# Patient Record
Sex: Male | Born: 1985 | Race: Black or African American | Hispanic: No | Marital: Married | Smoking: Never smoker
Health system: Southern US, Community
[De-identification: ages and names within clinical notes are randomized; demographics above are authoritative.]

## PROBLEM LIST (undated history)

## (undated) HISTORY — PX: KNEE SURGERY: SHX244

## (undated) HISTORY — PX: CIRCUMCISION: SUR203

---

## 2019-10-26 ENCOUNTER — Ambulatory Visit: Payer: BLUE CROSS/BLUE SHIELD | Attending: Internal Medicine

## 2019-10-26 DIAGNOSIS — Z20822 Contact with and (suspected) exposure to covid-19: Secondary | ICD-10-CM

## 2019-10-27 LAB — NOVEL CORONAVIRUS, NAA: SARS-CoV-2, NAA: DETECTED — AB

## 2020-01-01 ENCOUNTER — Emergency Department (HOSPITAL_COMMUNITY): Payer: BLUE CROSS/BLUE SHIELD

## 2020-01-01 ENCOUNTER — Other Ambulatory Visit: Payer: Self-pay

## 2020-01-01 ENCOUNTER — Emergency Department (HOSPITAL_COMMUNITY)
Admission: EM | Admit: 2020-01-01 | Discharge: 2020-01-01 | Disposition: A | Discharge status: Home / Self Care | Payer: BLUE CROSS/BLUE SHIELD | Attending: Emergency Medicine | Admitting: Emergency Medicine

## 2020-01-01 ENCOUNTER — Encounter (HOSPITAL_COMMUNITY): Payer: Self-pay | Admitting: Radiology

## 2020-01-01 DIAGNOSIS — Z8611 Personal history of tuberculosis: Secondary | ICD-10-CM | POA: Insufficient documentation

## 2020-01-01 DIAGNOSIS — Z79899 Other long term (current) drug therapy: Secondary | ICD-10-CM | POA: Insufficient documentation

## 2020-01-01 DIAGNOSIS — R0789 Other chest pain: Secondary | ICD-10-CM

## 2020-01-01 LAB — COMPREHENSIVE METABOLIC PANEL
ALT: 24 U/L (ref 0–44)
AST: 23 U/L (ref 15–41)
Albumin: 4.6 g/dL (ref 3.5–5.0)
Alkaline Phosphatase: 41 U/L (ref 38–126)
Anion gap: 10 (ref 5–15)
BUN: 11 mg/dL (ref 6–20)
CO2: 25 mmol/L (ref 22–32)
Calcium: 9.4 mg/dL (ref 8.9–10.3)
Chloride: 103 mmol/L (ref 98–111)
Creatinine, Ser: 1.08 mg/dL (ref 0.61–1.24)
GFR calc Af Amer: >60 mL/min (ref >60)
GFR calc non Af Amer: >60 mL/min (ref >60)
Glucose, Bld: 93 mg/dL (ref 70–99)
Potassium: 3.8 mmol/L (ref 3.5–5.1)
Sodium: 138 mmol/L (ref 135–145)
Total Bilirubin: 0.6 mg/dL (ref 0.3–1.2)
Total Protein: 7.6 g/dL (ref 6.5–8.1)

## 2020-01-01 LAB — CBC WITH DIFFERENTIAL/PLATELET
Abs Immature Granulocytes: 0.01 10*3/uL (ref 0.00–0.07)
Basophils Absolute: 0 10*3/uL (ref 0.0–0.1)
Basophils Relative: 1 %
Eosinophils Absolute: 0 10*3/uL (ref 0.0–0.5)
Eosinophils Relative: 1 %
HCT: 48.6 % (ref 39.0–52.0)
Hemoglobin: 15.8 g/dL (ref 13.0–17.0)
Immature Granulocytes: 0 %
Lymphocytes Relative: 60 %
Lymphs Abs: 2.3 10*3/uL (ref 0.7–4.0)
MCH: 27 pg (ref 26.0–34.0)
MCHC: 32.5 g/dL (ref 30.0–36.0)
MCV: 82.9 fL (ref 80.0–100.0)
Monocytes Absolute: 0.3 10*3/uL (ref 0.1–1.0)
Monocytes Relative: 8 %
Neutro Abs: 1.2 10*3/uL — ABNORMAL LOW (ref 1.7–7.7)
Neutrophils Relative %: 30 %
Platelets: 223 10*3/uL (ref 150–400)
RBC: 5.86 MIL/uL — ABNORMAL HIGH (ref 4.22–5.81)
RDW: 12.9 % (ref 11.5–15.5)
WBC: 3.9 10*3/uL — ABNORMAL LOW (ref 4.0–10.5)
nRBC: 0 % (ref 0.0–0.2)

## 2020-01-01 LAB — LIPASE, BLOOD: Lipase: 34 U/L (ref 11–51)

## 2020-01-01 LAB — CHOLESTEROL, TOTAL: Cholesterol: 226 mg/dL — ABNORMAL HIGH (ref 0–200)

## 2020-01-01 LAB — D-DIMER, QUANTITATIVE: D-Dimer, Quant: 0.98 ug/mL-FEU — ABNORMAL HIGH (ref 0.00–0.50)

## 2020-01-01 MED ORDER — IOHEXOL 350 MG/ML SOLN
100.0000 mL | Freq: Once | INTRAVENOUS | Status: AC | PRN
  Administered 2020-01-01: 100 mL via INTRAVENOUS

## 2020-01-01 MED ORDER — SODIUM CHLORIDE (PF) 0.9 % IJ SOLN
INTRAMUSCULAR | Status: AC
  Filled 2020-01-01: qty 50

## 2020-01-01 MED ORDER — PANTOPRAZOLE SODIUM 20 MG PO TBEC: 20.0000 mg | DELAYED_RELEASE_TABLET | Freq: Every day | ORAL | 0 refills | Status: AC

## 2020-01-01 NOTE — ED Provider Notes (Addendum)
[facility]-EMERGENCY DEPT Provider Note   CSN: [id number] Arrival date & time: 01/01/20  0741     History Chief Complaint  Patient presents with  . Chest Pain    Edwin Griffin is a 34 y.o. male.  HPI Patient reports that he has been getting sharp chest pains off and on for several weeks.  Pains have gotten more persistent and intense over the past several days.  Last night, patient reports that he was having pains that were making it difficult to sleep.  Pains are typically sharp and focal.  He reports there are 3 fairly specific areas that this occurs.  One of them is in his mid thoracic back.  There are then 2 additional sites, one on each side of the chest at about the area of the nipple or slightly lateral.  He reports it might at times be one of them that sends a pain and then at other times may be all 3 was sent to pain.  He did historically get brief twinges of pain that were similar but the frequency and intensity have now increased.  Patient denies he feels short of breath with this aside from the fact that when the pain hits and is fairly intense it catches his breath.  There is a positional quality.  He reports that he was working out yesterday and everything is going fine and then one of the pains hit him.  No associated symptoms.  Patient denies he has any cough.  He has not had fevers or chills.  Patient reports he had tuberculosis 10 years ago.  He reports at that time he had similar pains that were much worse.  Ultimately, all of those symptoms resolved and he got better.  He has not had any pain or swelling of the legs or calves.  He denies any radiation of pain into his arms.  Symptoms are not particularly exacerbated by eating.  He reports sometimes though he thinks that the pain is "gas".  If he drinks a warm tea that may improve the symptoms.    No past medical history on file.  There are no problems to display for this patient.        No  family history on file.  Social History   Tobacco Use  . Smoking status: Not on file  Substance Use Topics  . Alcohol use: Not on file  . Drug use: Not on file    Home Medications Prior to Admission medications   Medication Sig Start Date End Date Taking? Authorizing Provider  Cholecalciferol (VITAMIN D-3) 25 MCG (1000 UT) CAPS Take 1,000 Units by mouth 2 (two) times a week.   Yes [provider]  Multiple Vitamins-Minerals (MULTIVITAMIN WITH MINERALS) tablet Take 1 tablet by mouth daily.   Yes [provider]  Probiotic Product (PROBIOTIC DAILY PO) Take 1 tablet by mouth daily at 12 noon. Prostate   Yes [provider]  pantoprazole (PROTONIX) 20 MG tablet Take 1 tablet (20 mg total) by mouth daily. 01/01/20   Arby Barrette, MD    Allergies    Patient has no known allergies.  Review of Systems   Review of Systems 10 Systems reviewed and are negative for acute change except as noted in the HPI.  Physical Exam Updated Vital Signs BP (!) 142/78   Pulse 72   Temp 98.2 F (36.8 C) (Oral)   Resp 11   Ht 5\' 11"  (1.803 m)   Wt 90.7 kg  SpO2 100%   BMI 27.89 kg/m   Physical Exam Constitutional:      Comments: Patient is alert nontoxic and clinically well in appearance.  HENT:     Head: Normocephalic and atraumatic.  Eyes:     Extraocular Movements: Extraocular movements intact.  Cardiovascular:     Rate and Rhythm: Normal rate and regular rhythm.  Pulmonary:     Effort: Pulmonary effort is normal.     Breath sounds: Normal breath sounds.  Chest:     Chest wall: No tenderness.  Abdominal:     General: There is no distension.     Palpations: Abdomen is soft.     Tenderness: There is no abdominal tenderness. There is no guarding.  Musculoskeletal:        General: No swelling or tenderness. Normal range of motion.     Cervical back: Neck supple.     Right lower leg: No edema.     Left lower leg: No edema.  Skin:    General: Skin is warm  and dry.  Neurological:     General: No focal deficit present.     Mental Status: He is oriented to person, place, and time.     Cranial Nerves: No cranial nerve deficit.     Coordination: Coordination normal.  Psychiatric:        Mood and Affect: Mood normal.     ED Results / Procedures / Treatments   Labs (all labs ordered are listed, but only abnormal results are displayed) Labs Reviewed  CBC WITH DIFFERENTIAL/PLATELET - Abnormal; Notable for the following components:      Result Value   WBC 3.9 (*)    RBC 5.86 (*)    Neutro Abs 1.2 (*)    All other components within normal limits  D-DIMER, QUANTITATIVE (NOT AT [facility]) - Abnormal; Notable for the following components:   D-Dimer, Quant 0.98 (*)    All other components within normal limits  COMPREHENSIVE METABOLIC PANEL  LIPASE, BLOOD  CHOLESTEROL, TOTAL    EKG EKG Interpretation  Date/Time:  Tuesday January 01 2020 07:53:02 EST Ventricular Rate:  57 PR Interval:    QRS Duration: 92 QT Interval:  391 QTC Calculation: 381 R Axis:   63 Text Interpretation: Sinus rhythm ST elev, probable normal early repol pattern agree, no old comparison Confirmed by Charlesetta Shanks [phone]) on 01/01/2020 8:01:40 AM   Radiology DG Chest 2 View  Result Date: 01/01/2020 CLINICAL DATA:  Chest pain EXAM: CHEST - 2 VIEW COMPARISON:  None. FINDINGS: Lungs are clear. The heart size and pulmonary vascularity are normal. No adenopathy. No pneumothorax. No bone lesions. IMPRESSION: No abnormality noted. Electronically Signed   By: Lowella Grip III M.D.   On: 01/01/2020 09:02   CT Angio Chest PE W/Cm &/Or Wo Cm  Result Date: 01/01/2020 CLINICAL DATA:  Shortness of breath and chest pain EXAM: CT ANGIOGRAPHY CHEST WITH CONTRAST TECHNIQUE: Multidetector CT imaging of the chest was performed using the standard protocol during bolus administration of intravenous contrast. Multiplanar CT image reconstructions and MIPs were obtained to evaluate the  vascular anatomy. CONTRAST:  146mL OMNIPAQUE IOHEXOL 350 MG/ML SOLN COMPARISON:  January 01, 2020 FINDINGS: Cardiovascular: There is no demonstrable pulmonary embolus. There is no thoracic aortic aneurysm or dissection. Visualized great vessels appear normal. No pericardial effusion or pericardial thickening evident. The main pulmonary outflow tract measures 3.7 cm, prominent. Mediastinum/Nodes: Visualized thyroid appears normal. No appreciable thoracic adenopathy. No esophageal lesions are evident. Lungs/Pleura: There is  atelectatic change in the left apical region with questionable mild superimposed scarring. There is no evident edema or airspace opacity. There is slight atelectasis in the left base. No pleural effusions are evident. Upper Abdomen: Visualized upper abdominal structures appear normal. Musculoskeletal: No blastic or lytic bone lesions. No evident chest wall lesions. Review of the MIP images confirms the above findings. IMPRESSION: 1. No demonstrable pulmonary embolus. No thoracic aortic aneurysm or dissection. 2. Prominence of the main pulmonary outflow tract, a finding indicative of pulmonary arterial hypertension. 3. Atelectasis and mild scarring left apex. Atelectasis left base. No edema or airspace opacity. 4.  No evident adenopathy. Electronically Signed   By: Bretta Bang III M.D.   On: 01/01/2020 11:37    Procedures Procedures (including critical care time)  Medications Ordered in ED Medications  sodium chloride (PF) 0.9 % injection (has no administration in time range)  iohexol (OMNIPAQUE) 350 MG/ML injection 100 mL (100 mLs Intravenous Contrast Given 01/01/20 1120)    ED Course  I have reviewed the triage vital signs and the nursing notes.  Pertinent labs & imaging results that were available during my care of the patient were reviewed by me and considered in my medical decision making (see chart for details).    MDM Rules/Calculators/A&P                     Patient  has variable pleuritic type chest pain.  D-dimer was elevated.  CT PE study negative for acute findings.  No infiltrates or signs of infectious etiology.  No lymphadenopathy.  Patient does have history of TB but no findings of cavitary lesions or significant, large scarring or bullae.  Patient is clinically well in appearance.  No fevers no myalgias no shortness of breath aside from occasional limitation of inspiration due to pain.  At this time, differential diagnosis includes pleurisy also consideration for esophageal etiology such as reflux or esophageal spasm.  Will recommend a trial of 2 weeks of Protonix.  Patient is to follow-up with his PCP ASAP.  Return precautions included in discharge instructions. Final Clinical Impression(s) / ED Diagnoses Final diagnoses:  Atypical chest pain  H/O tuberculosis    Rx / DC Orders ED Discharge Orders         Ordered    pantoprazole (PROTONIX) 20 MG tablet  Daily     01/01/20 1207           Arby Barrette, MD 01/01/20 1212    Arby Barrette, MD 01/01/20 1215

## 2020-01-01 NOTE — ED Notes (Signed)
Pt transported to CT at this time.

## 2020-01-01 NOTE — Discharge Instructions (Signed)
1.  Schedule follow-up with your doctor when you return home within the next 1 to 2 weeks. 2.  Start taking Protonix daily for 2 weeks.  Follow instructions for diet for gastroesophageal reflux disease. 3.  Return to the emergency department if you develop fevers, shortness of breath, persisting or worsening chest pain or other concerning symptoms.

## 2020-01-01 NOTE — ED Triage Notes (Signed)
Pateint complains of intermittent chest pain, gets worse with exertion and occasional SOB with pain, describes it as mid-chest that radiates to R chest and back. Hx of TB, states it is in remission and COVID in December.

## 2020-01-01 NOTE — ED Notes (Signed)
ED Provider at bedside. 

## 2020-02-24 IMAGING — CT CT ANGIO CHEST
2 of 6 series · 18 of 36 positions shown · IV contrast (OMNIPAQUE 350)
Comparison: January 01, 2020

CLINICAL DATA: Shortness of breath and chest pain

EXAM:
CT ANGIOGRAPHY CHEST WITH CONTRAST
TECHNIQUE: Multidetector CT imaging of the chest was performed using the
standard protocol during bolus administration of intravenous
contrast. Multiplanar CT image reconstructions and MIPs were
obtained to evaluate the vascular anatomy.
CONTRAST:  100mL OMNIPAQUE IOHEXOL 350 MG/ML SOLN

[Series 5: thins · axial · 0.66mm/px · z∈[-314,-92]mm · 17 of 250 slices shown]
[im 14/250  lung]
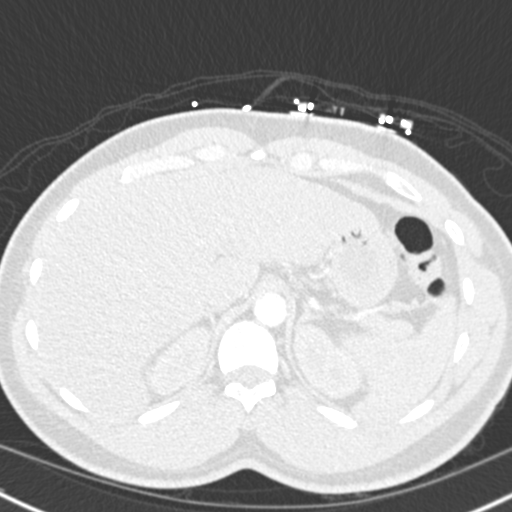
[im 28/250  mediastinal]
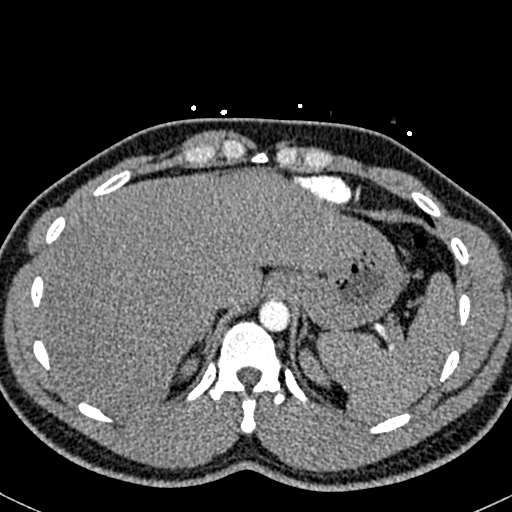
[im 42/250  lung]
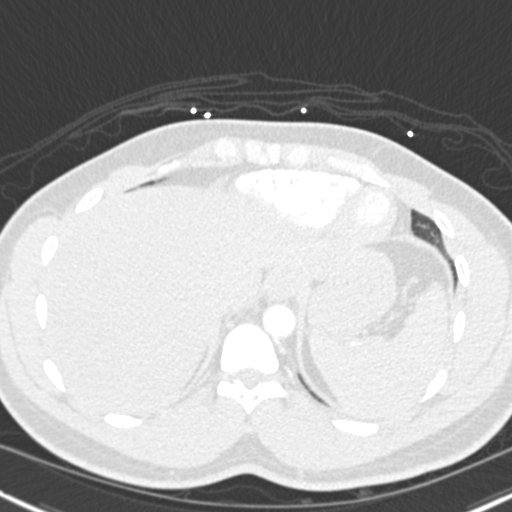
[im 56/250  mediastinal]
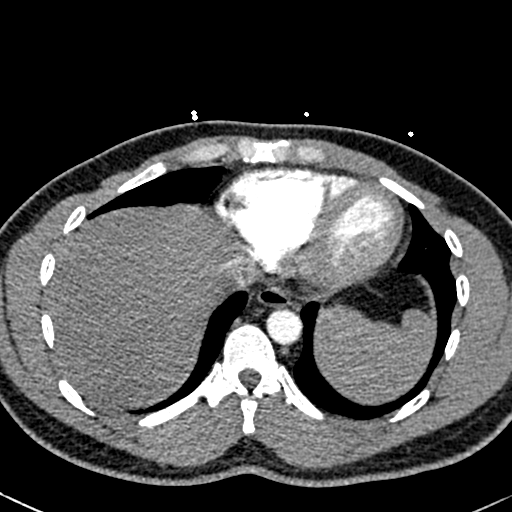
[im 70/250  lung]
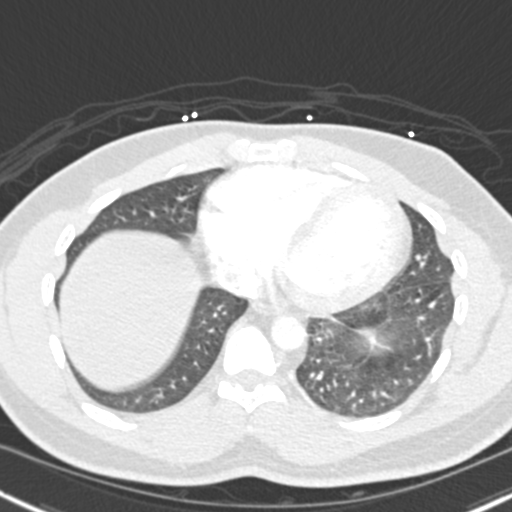
[im 84/250  mediastinal]
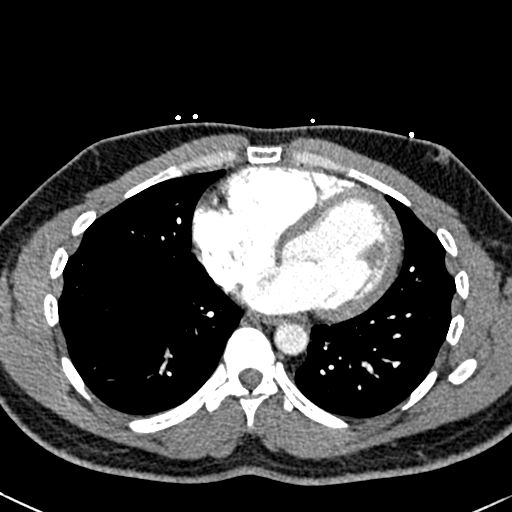
[im 97/250  lung]
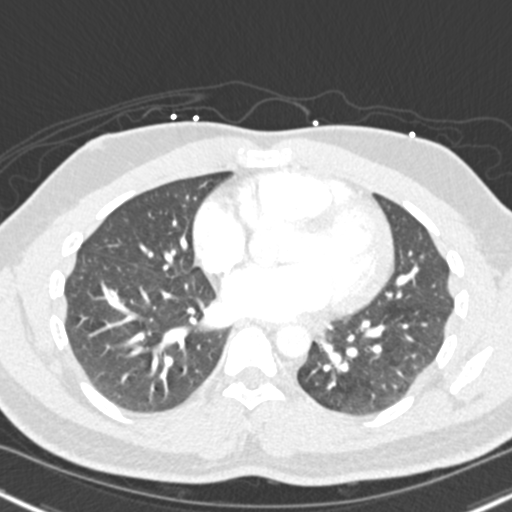
[im 111/250  mediastinal]
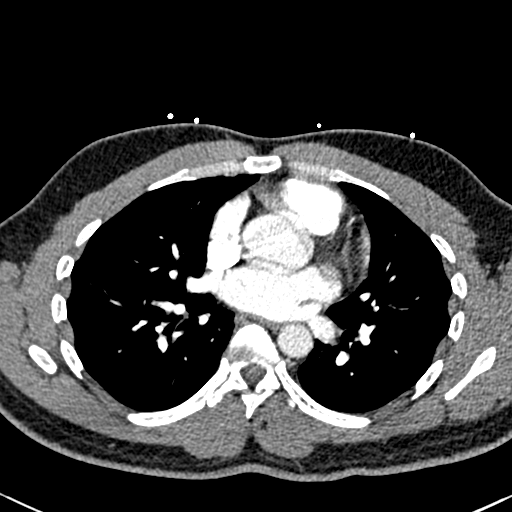
[im 125/250  lung]
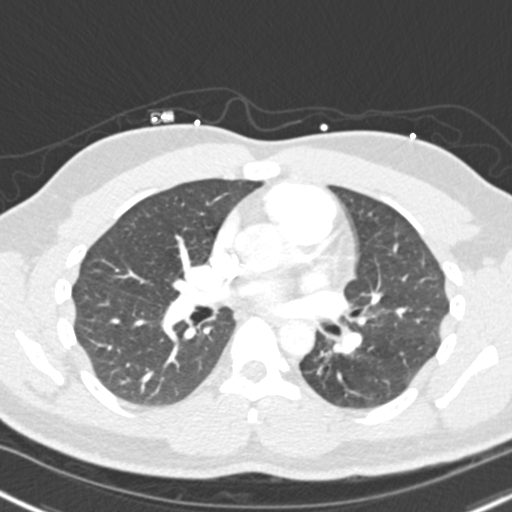
[im 139/250  mediastinal]
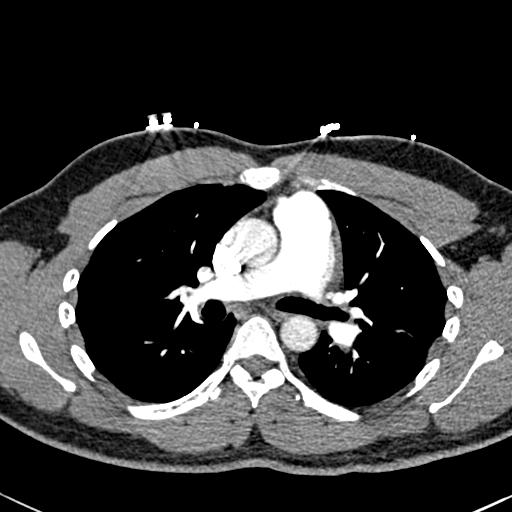
[im 153/250  lung]
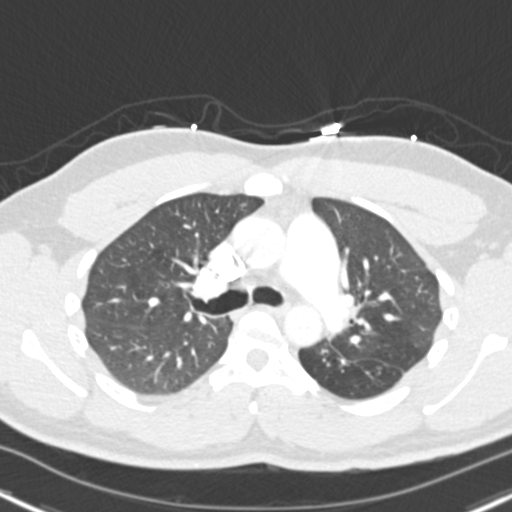
[im 167/250  mediastinal]
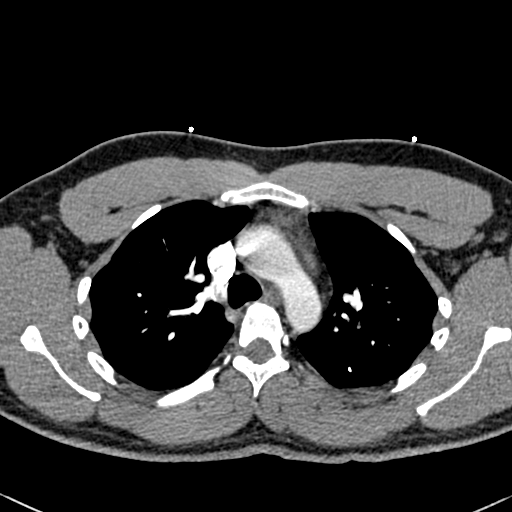
[im 180/250  lung]
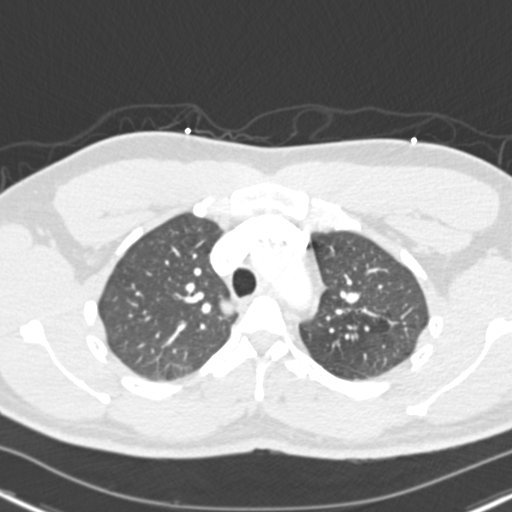
[im 194/250  mediastinal]
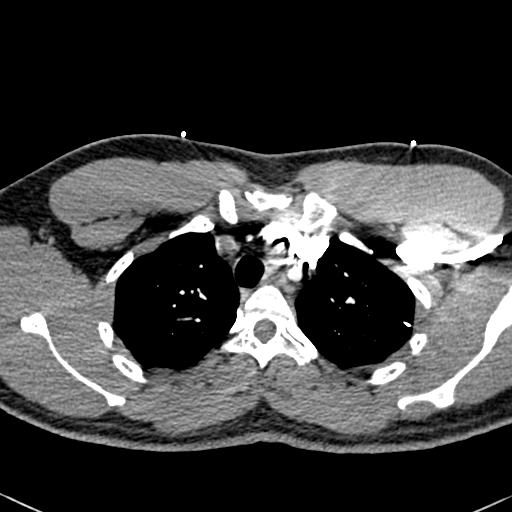
[im 208/250  lung]
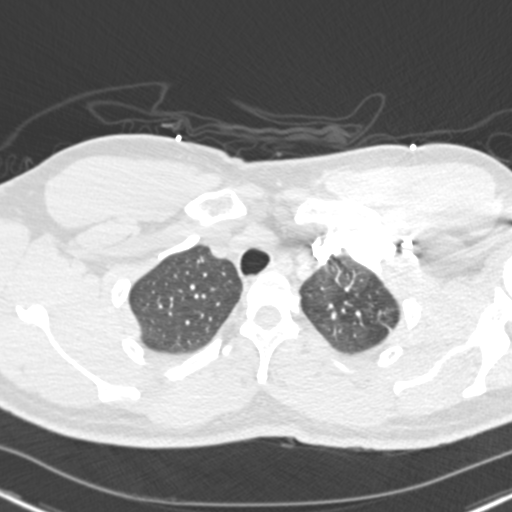
[im 222/250  mediastinal]
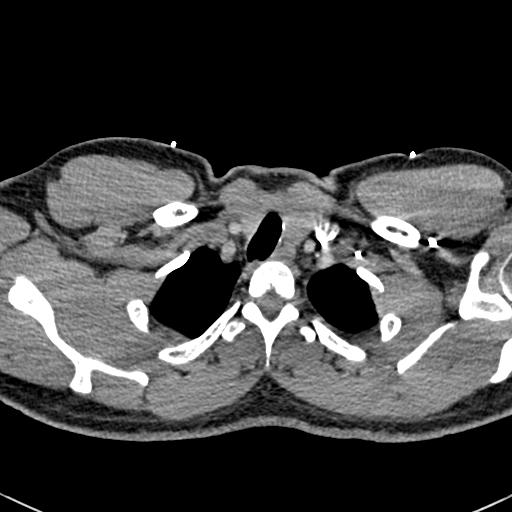
[im 236/250  lung]
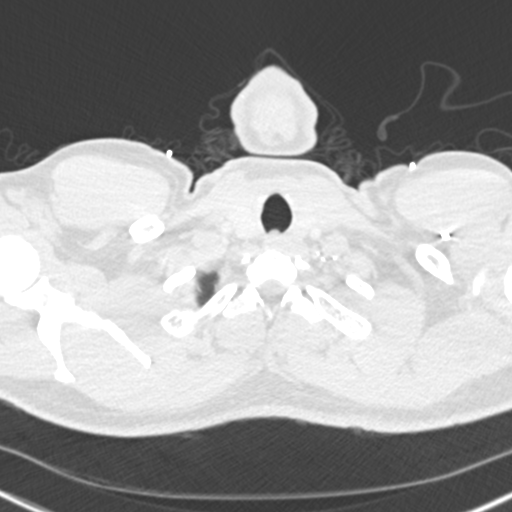

[Series 7: coronal mpr · coronal · 0.51mm/px · 1 of 115 slices shown]
[im 58/115  mediastinal]
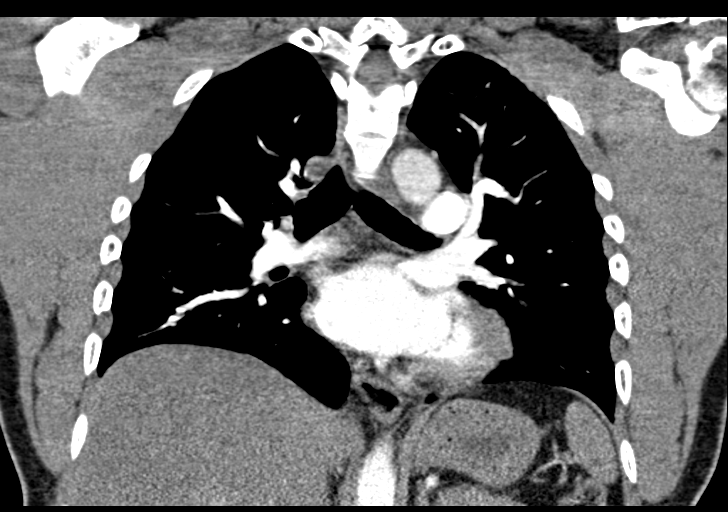

[18 of 36 positions shown; findings below may reference images not displayed]

FINDINGS: Cardiovascular: There is no demonstrable pulmonary embolus. There is
no thoracic aortic aneurysm or dissection. Visualized great vessels
appear normal. No pericardial effusion or pericardial thickening
evident.

The main pulmonary outflow tract measures 3.7 cm, prominent.

Mediastinum/Nodes: Visualized thyroid appears normal. No appreciable
thoracic adenopathy. No esophageal lesions are evident.

Lungs/Pleura: There is atelectatic change in the left apical region
with questionable mild superimposed scarring. There is no evident
edema or airspace opacity. There is slight atelectasis in the left
base. No pleural effusions are evident.

Upper Abdomen: Visualized upper abdominal structures appear normal.

Musculoskeletal: No blastic or lytic bone lesions. No evident chest
wall lesions.

Review of the MIP images confirms the above findings.
IMPRESSION: 1. No demonstrable pulmonary embolus. No thoracic aortic aneurysm or
dissection.

2. Prominence of the main pulmonary outflow tract, a finding
indicative of pulmonary arterial hypertension.

3. Atelectasis and mild scarring left apex. Atelectasis left base.
No edema or airspace opacity.

4.  No evident adenopathy.

## 2020-11-19 ENCOUNTER — Other Ambulatory Visit: Payer: Self-pay

## 2020-11-19 ENCOUNTER — Emergency Department (HOSPITAL_COMMUNITY)
Admission: EM | Admit: 2020-11-19 | Discharge: 2020-11-20 | Disposition: A | Discharge status: Home / Self Care | Payer: BLUE CROSS/BLUE SHIELD | Attending: Emergency Medicine | Admitting: Emergency Medicine

## 2020-11-19 ENCOUNTER — Encounter (HOSPITAL_COMMUNITY): Payer: Self-pay

## 2020-11-19 DIAGNOSIS — Z5321 Procedure and treatment not carried out due to patient leaving prior to being seen by health care provider: Secondary | ICD-10-CM | POA: Diagnosis not present

## 2020-11-19 DIAGNOSIS — R0789 Other chest pain: Secondary | ICD-10-CM | POA: Insufficient documentation

## 2020-11-19 LAB — BASIC METABOLIC PANEL
Anion gap: 11 (ref 5–15)
BUN: 12 mg/dL (ref 6–20)
CO2: 24 mmol/L (ref 22–32)
Calcium: 9.4 mg/dL (ref 8.9–10.3)
Chloride: 102 mmol/L (ref 98–111)
Creatinine, Ser: 1.1 mg/dL (ref 0.61–1.24)
GFR, Estimated: >60 mL/min (ref >60)
Glucose, Bld: 106 mg/dL — ABNORMAL HIGH (ref 70–99)
Potassium: 4.1 mmol/L (ref 3.5–5.1)
Sodium: 137 mmol/L (ref 135–145)

## 2020-11-19 LAB — CBC
HCT: 45.6 % (ref 39.0–52.0)
Hemoglobin: 14.8 g/dL (ref 13.0–17.0)
MCH: 27.3 pg (ref 26.0–34.0)
MCHC: 32.5 g/dL (ref 30.0–36.0)
MCV: 84.1 fL (ref 80.0–100.0)
Platelets: 271 10*3/uL (ref 150–400)
RBC: 5.42 MIL/uL (ref 4.22–5.81)
RDW: 13.2 % (ref 11.5–15.5)
WBC: 5.1 10*3/uL (ref 4.0–10.5)
nRBC: 0 % (ref 0.0–0.2)

## 2020-11-19 LAB — TROPONIN I (HIGH SENSITIVITY): Troponin I (High Sensitivity): 2 ng/L (ref <18)

## 2020-11-19 NOTE — ED Triage Notes (Signed)
Pt arrived via walk in, c/o midsternal chest pain worsening with deep breathing, radiating to right arm x4 days. States family tested positive for covid x2 weeks ago, he tested negative and never had sx.

## 2020-11-19 NOTE — ED Notes (Signed)
Pt sts he cant wait any longer and is leaving

## 2021-01-02 ENCOUNTER — Emergency Department (HOSPITAL_COMMUNITY): Payer: BLUE CROSS/BLUE SHIELD

## 2021-01-02 ENCOUNTER — Encounter (HOSPITAL_COMMUNITY): Payer: Self-pay

## 2021-01-02 ENCOUNTER — Other Ambulatory Visit: Payer: Self-pay

## 2021-01-02 ENCOUNTER — Emergency Department (HOSPITAL_COMMUNITY)
Admission: EM | Admit: 2021-01-02 | Discharge: 2021-01-02 | Disposition: A | Discharge status: Home / Self Care | Payer: BLUE CROSS/BLUE SHIELD | Attending: Emergency Medicine | Admitting: Emergency Medicine

## 2021-01-02 DIAGNOSIS — M545 Low back pain, unspecified: Secondary | ICD-10-CM | POA: Diagnosis not present

## 2021-01-02 DIAGNOSIS — A09 Infectious gastroenteritis and colitis, unspecified: Secondary | ICD-10-CM | POA: Insufficient documentation

## 2021-01-02 DIAGNOSIS — R6889 Other general symptoms and signs: Secondary | ICD-10-CM

## 2021-01-02 DIAGNOSIS — R509 Fever, unspecified: Secondary | ICD-10-CM | POA: Diagnosis not present

## 2021-01-02 DIAGNOSIS — R0981 Nasal congestion: Secondary | ICD-10-CM | POA: Diagnosis not present

## 2021-01-02 DIAGNOSIS — Z20822 Contact with and (suspected) exposure to covid-19: Secondary | ICD-10-CM | POA: Diagnosis not present

## 2021-01-02 DIAGNOSIS — R079 Chest pain, unspecified: Secondary | ICD-10-CM | POA: Diagnosis not present

## 2021-01-02 LAB — COMPREHENSIVE METABOLIC PANEL
ALT: 24 U/L (ref 0–44)
AST: 22 U/L (ref 15–41)
Albumin: 4.5 g/dL (ref 3.5–5.0)
Alkaline Phosphatase: 46 U/L (ref 38–126)
Anion gap: 9 (ref 5–15)
BUN: 10 mg/dL (ref 6–20)
CO2: 26 mmol/L (ref 22–32)
Calcium: 9.7 mg/dL (ref 8.9–10.3)
Chloride: 101 mmol/L (ref 98–111)
Creatinine, Ser: 1.02 mg/dL (ref 0.61–1.24)
GFR, Estimated: >60 mL/min (ref >60)
Glucose, Bld: 97 mg/dL (ref 70–99)
Potassium: 3.7 mmol/L (ref 3.5–5.1)
Sodium: 136 mmol/L (ref 135–145)
Total Bilirubin: 0.7 mg/dL (ref 0.3–1.2)
Total Protein: 7.9 g/dL (ref 6.5–8.1)

## 2021-01-02 LAB — CBC WITH DIFFERENTIAL/PLATELET
Abs Immature Granulocytes: 0.01 10*3/uL (ref 0.00–0.07)
Basophils Absolute: 0 10*3/uL (ref 0.0–0.1)
Basophils Relative: 1 %
Eosinophils Absolute: 0.1 10*3/uL (ref 0.0–0.5)
Eosinophils Relative: 1 %
HCT: 46.3 % (ref 39.0–52.0)
Hemoglobin: 15 g/dL (ref 13.0–17.0)
Immature Granulocytes: 0 %
Lymphocytes Relative: 29 %
Lymphs Abs: 1.7 10*3/uL (ref 0.7–4.0)
MCH: 26.8 pg (ref 26.0–34.0)
MCHC: 32.4 g/dL (ref 30.0–36.0)
MCV: 82.7 fL (ref 80.0–100.0)
Monocytes Absolute: 0.4 10*3/uL (ref 0.1–1.0)
Monocytes Relative: 7 %
Neutro Abs: 3.6 10*3/uL (ref 1.7–7.7)
Neutrophils Relative %: 62 %
Platelets: 237 10*3/uL (ref 150–400)
RBC: 5.6 MIL/uL (ref 4.22–5.81)
RDW: 13 % (ref 11.5–15.5)
WBC: 5.9 10*3/uL (ref 4.0–10.5)
nRBC: 0 % (ref 0.0–0.2)

## 2021-01-02 LAB — RESP PANEL BY RT-PCR (FLU A&B, COVID) ARPGX2
Influenza A by PCR: NEGATIVE
Influenza B by PCR: NEGATIVE
SARS Coronavirus 2 by RT PCR: NEGATIVE

## 2021-01-02 LAB — PARASITE EXAM SCREEN, BLOOD-W CONF TO LABCORP (NOT @ ARMC)

## 2021-01-02 LAB — TROPONIN I (HIGH SENSITIVITY): Troponin I (High Sensitivity): 3 ng/L (ref <18)

## 2021-01-02 MED ORDER — ACETAMINOPHEN 325 MG PO TABS
650.0000 mg | ORAL_TABLET | Freq: Once | ORAL | Status: AC
  Administered 2021-01-02: 650 mg via ORAL
  Filled 2021-01-02: qty 2

## 2021-01-02 MED ORDER — AZITHROMYCIN 250 MG PO TABS
1000.0000 mg | ORAL_TABLET | Freq: Once | ORAL | Status: AC
  Administered 2021-01-02: 1000 mg via ORAL
  Filled 2021-01-02: qty 4

## 2021-01-02 NOTE — ED Triage Notes (Signed)
Patient c/o chest pain, fever, diarrhea, and left lower back pain that radiates into the left leg since yesterday.

## 2021-01-02 NOTE — ED Provider Notes (Addendum)
[facility]-EMERGENCY DEPT Provider Note   CSN: [id number] Arrival date & time: 01/02/21  0844     History Chief Complaint  Patient presents with  . Back Pain  . Fever  . Diarrhea  . Chest Pain    Edwin Griffin is a 35 y.o. male with prior past medical history of TB in 2010, presents emergency department today for fevers, chills, diarrhea, chest pain, congestion and back pain for the past 2 to 3 days.  Patient states that he has had dysentery for the past 2 days in addition to sharp chest pain in his chest that is intermittent.  Not worse in a certain position, not pleuritic, does not radiate anywhere.  States that he is also having lower back pain that will sometimes radiate down into his leg.  Denies any numbness or tingling or weakness.  Denies any gait abnormality.  Denies any saddle paresthesias, back trauma, urinary incontinence or dysuria.  Denies any abdominal pain.  Denies any shortness of breath.  Denies any headache, blurry vision or vision changes or eye pain.  Denies any rash.  Also states that he has had fever for the past 2 days, did not take his temperature today, however said that he was pouring sweat throughout the night last night.  States that his child has bronchitis, otherwise no other sick contacts.  Patient states that he did recently go to [place], came back a couple days ago.  States that he had a negative Covid test when he came back.  Has been vaccinated and boosted against COVID.  Denies any cough or hemoptysis.  No other complaints at this time.  Denies any nausea or vomiting.Marland Kitchen  HPI     History reviewed. No pertinent past medical history.  There are no problems to display for this patient.   Past Surgical History:  Procedure Laterality Date  . CIRCUMCISION    . KNEE SURGERY         History reviewed. No pertinent family history.  Social History   Tobacco Use  . Smoking status: Never Smoker  . Smokeless tobacco: Never Used   Vaping Use  . Vaping Use: Never used  Substance Use Topics  . Alcohol use: Yes  . Drug use: Never    Home Medications Prior to Admission medications   Medication Sig Start Date End Date Taking? Authorizing Provider  Cholecalciferol (VITAMIN D-3) 25 MCG (1000 UT) CAPS Take 1,000 Units by mouth daily as needed (immune support).   Yes [provider]  Misc Natural Products (PROSTATE HEALTH PO) Take 1 tablet by mouth daily as needed (immune supprt).   Yes [provider]  Multiple Vitamins-Minerals (MULTIVITAMIN WITH MINERALS) tablet Take 1 tablet by mouth daily.   Yes [provider]  Probiotic Product (PROBIOTIC DAILY PO) Take 1 tablet by mouth daily as needed (immune support).   Yes [provider]  pantoprazole (PROTONIX) 20 MG tablet Take 1 tablet (20 mg total) by mouth daily. Patient not taking: Reported on 01/02/2021 01/01/20   Arby Barrette, MD    Allergies    Patient has no known allergies.  Review of Systems   Review of Systems  Constitutional: Positive for chills, fatigue and fever. Negative for diaphoresis.  HENT: Positive for congestion. Negative for sore throat and trouble swallowing.   Eyes: Negative for pain and visual disturbance.  Respiratory: Negative for cough, shortness of breath and wheezing.   Cardiovascular: Positive for chest pain. Negative for palpitations and leg swelling.  Gastrointestinal: Positive for diarrhea. Negative for abdominal distention, abdominal pain, nausea and vomiting.  Genitourinary: Negative for difficulty urinating.  Musculoskeletal: Positive for arthralgias and back pain. Negative for neck pain and neck stiffness.  Skin: Negative for pallor.  Neurological: Negative for dizziness, speech difficulty, weakness, light-headedness and headaches.  Psychiatric/Behavioral: Negative for confusion.    Physical Exam Updated Vital Signs BP 123/84   Pulse 73   Temp 98.6 F (37 C) (Oral)   Resp (!) 21   Ht 5'  11" (1.803 m)   Wt 88.5 kg   SpO2 99%   BMI 27.20 kg/m   Physical Exam Constitutional:      General: He is not in acute distress.    Appearance: Normal appearance. He is not ill-appearing, toxic-appearing or diaphoretic.  HENT:     Mouth/Throat:     Mouth: Mucous membranes are moist.     Pharynx: Oropharynx is clear. Uvula midline. No pharyngeal swelling or oropharyngeal exudate.     Tonsils: No tonsillar exudate or tonsillar abscesses.  Eyes:     General: No scleral icterus.    Extraocular Movements: Extraocular movements intact.     Pupils: Pupils are equal, round, and reactive to light.  Cardiovascular:     Rate and Rhythm: Normal rate and regular rhythm.     Pulses: Normal pulses.     Heart sounds: Normal heart sounds.  Pulmonary:     Effort: Pulmonary effort is normal. No respiratory distress.     Breath sounds: Normal breath sounds. No stridor. No wheezing, rhonchi or rales.  Chest:     Chest wall: No tenderness.  Abdominal:     General: Abdomen is flat. There is no distension.     Palpations: Abdomen is soft.     Tenderness: There is no abdominal tenderness. There is no guarding or rebound.  Musculoskeletal:        General: No swelling or tenderness. Normal range of motion.     Cervical back: Normal range of motion and neck supple. No rigidity.     Right lower leg: No edema.     Left lower leg: No edema.  Skin:    General: Skin is warm and dry.     Capillary Refill: Capillary refill takes less than 2 seconds.     Coloration: Skin is not pale.  Neurological:     General: No focal deficit present.     Mental Status: He is alert and oriented to person, place, and time.  Psychiatric:        Mood and Affect: Mood normal.        Behavior: Behavior normal.     ED Results / Procedures / Treatments   Labs (all labs ordered are listed, but only abnormal results are displayed) Labs Reviewed  RESP PANEL BY RT-PCR (FLU A&B, COVID) [id number]  GASTROINTESTINAL PANEL BY  PCR, STOOL (REPLACES STOOL CULTURE)  CBC WITH DIFFERENTIAL/PLATELET  COMPREHENSIVE METABOLIC PANEL  PARASITE EXAM SCREEN, BLOOD-W CONF TO [facility] (NOT @ [facility])  TROPONIN I (HIGH SENSITIVITY)    EKG None  Radiology DG Chest 2 View  Result Date: 01/02/2021 CLINICAL DATA:  Chest pain, fever EXAM: CHEST - 2 VIEW COMPARISON:  01/01/2020 FINDINGS: The heart size and mediastinal contours are within normal limits. Both lungs are clear. The visualized skeletal structures are unremarkable. IMPRESSION: No active cardiopulmonary disease. Electronically Signed   By: Judie Petit.  Shick M.D.   On: 01/02/2021 09:16    Procedures Procedures   Medications Ordered in ED  Medications  azithromycin (ZITHROMAX) tablet 1,000 mg (has no administration in time range)  acetaminophen (TYLENOL) tablet 650 mg (650 mg Oral Given 01/02/21 1006)    ED Course  I have reviewed the triage vital signs and the nursing notes.  Pertinent labs & imaging results that were available during my care of the patient were reviewed by me and considered in my medical decision making (see chart for details).  Clinical Course as of 01/02/21 1112  Fri Jan 02, 2021  1610 I was directly involved in this patients medical care.  [JH]    Clinical Course User Index [JH] Cheryll Cockayne, MD   MDM Rules/Calculators/A&P                          Shikhar Plucinski is a 35 y.o. male with prior past medical history of TB in 2010, presents emergency department today for fevers, chills, diarrhea, chest pain and back pain for the past 2 to 3 days.  Patient appears very well with benign physical exam, however with recent travel history I am concerned about malaria, other parasite borne illnesses, Covid.  Will obtain basic lab work and chest x-ray at this time.  Work-up today benign with CMP and CBC unremarkable, no leukocytosis which is reassuring and unlikely to be malaria. Chest x-ray interpreted me with no acute cardiopulmonary disease. Troponin III,  chest pain appears more like mylagias most likely MSK,did try to obtain PCR panel, patient is unable to provide sample. Negative Covid. Shared decision making with patient about dysentery, will give 1 g of azithromycin, did talk to Dr. Audley Hose about this who agrees. Patient will follow up with PCP if he continues to have symptoms, most likely viral syndrome, however will treat dysentery since patient did have reported fever at home and did recently travel. Patient upon reevaluation feels better with Tylenol. Work-up overall assuring. Patient to be discharged at this time. Pending malaria results.   Doubt need for further emergent work up at this time. I explained the diagnosis and have given explicit precautions to return to the ER including for any other new or worsening symptoms. The patient understands and accepts the medical plan as it's been dictated and I have answered their questions. Discharge instructions concerning home care and prescriptions have been given. The patient is STABLE and is discharged to home in good condition.  I discussed this case with my attending physician who cosigned this note including patient's presenting symptoms, physical exam, and planned diagnostics and interventions. Attending physician stated agreement with plan or made changes to plan which were implemented.   Attending physician assessed patient at bedside.    Final Clinical Impression(s) / ED Diagnoses Final diagnoses:  Flu-like symptoms  Dysentery    Rx / DC Orders ED Discharge Orders    None    \   Farrel Gordon, PA-C 01/02/21 1131    Cheryll Cockayne, MD 01/02/21 1451

## 2021-01-02 NOTE — ED Notes (Signed)
Pt states that he is unable to provide stool sample at this time. PA aware

## 2021-01-02 NOTE — Discharge Instructions (Addendum)
Your work-up today was reassuring, as we discussed you know to follow-up with your primary care doctor about your bloody diarrhea.  If you continue to have bloody diarrhea you need to you see a doctor as we spoke about.  Keep an eye on your temperature, please take Tylenol as directed on the bottle for pain.  Make sure to stay hydrated, this is also very important.  Your parasite malaria screen will come back in a couple days, please be sure to keep an eye on this which you can find on MyChart.  Your Covid test was negative. I want you to use the attached instructions, use the food choices to help relieve diarrhea attachment. If you have any new or worsening concerning symptoms please come back to the emergency department.  Get help right away if: You have chest pain. You feel extremely weak or you faint. The blood in your diarrhea increases or turns a different color. You vomit and the vomit is bloody or looks black. You have persistent diarrhea. You have severe pain, cramping, or bloating in your abdomen. You have trouble breathing or you are breathing very quickly. Your heart is beating very quickly. Your skin feels cold and clammy. You feel confused. You have signs of dehydration, such as: Dark urine, very little urine, or no urine. Cracked lips. Dry mouth. Sunken eyes. Sleepiness. Weakness.

## 2021-01-05 LAB — PARASITE EXAM, BLOOD

## 2021-02-25 IMAGING — CR DG CHEST 2V
2 series · 2 of 2 positions shown · non-contrast
Comparison: 01/01/2020

CLINICAL DATA: Chest pain, fever

EXAM:
CHEST - 2 VIEW

[w chest pa]
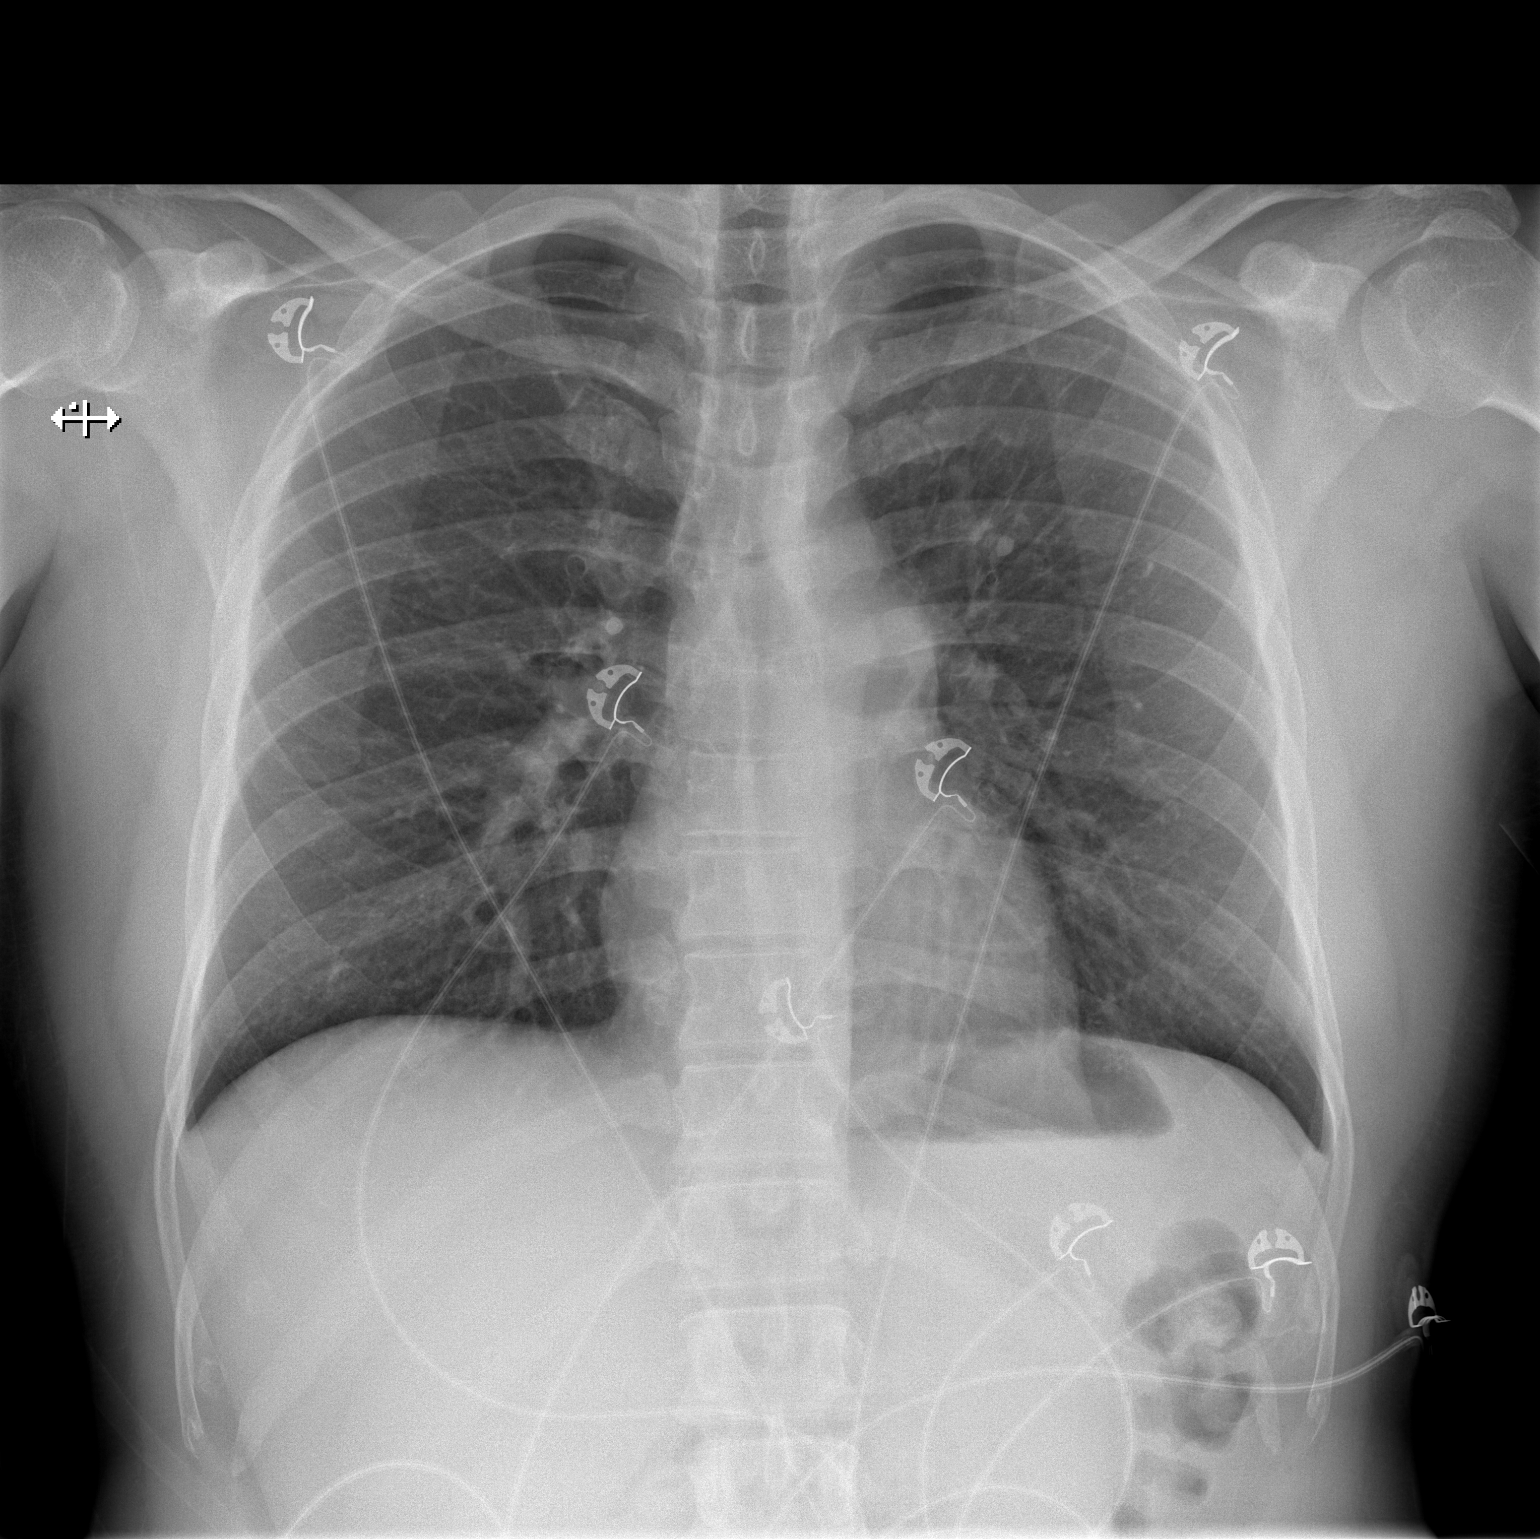

[w chest lat]
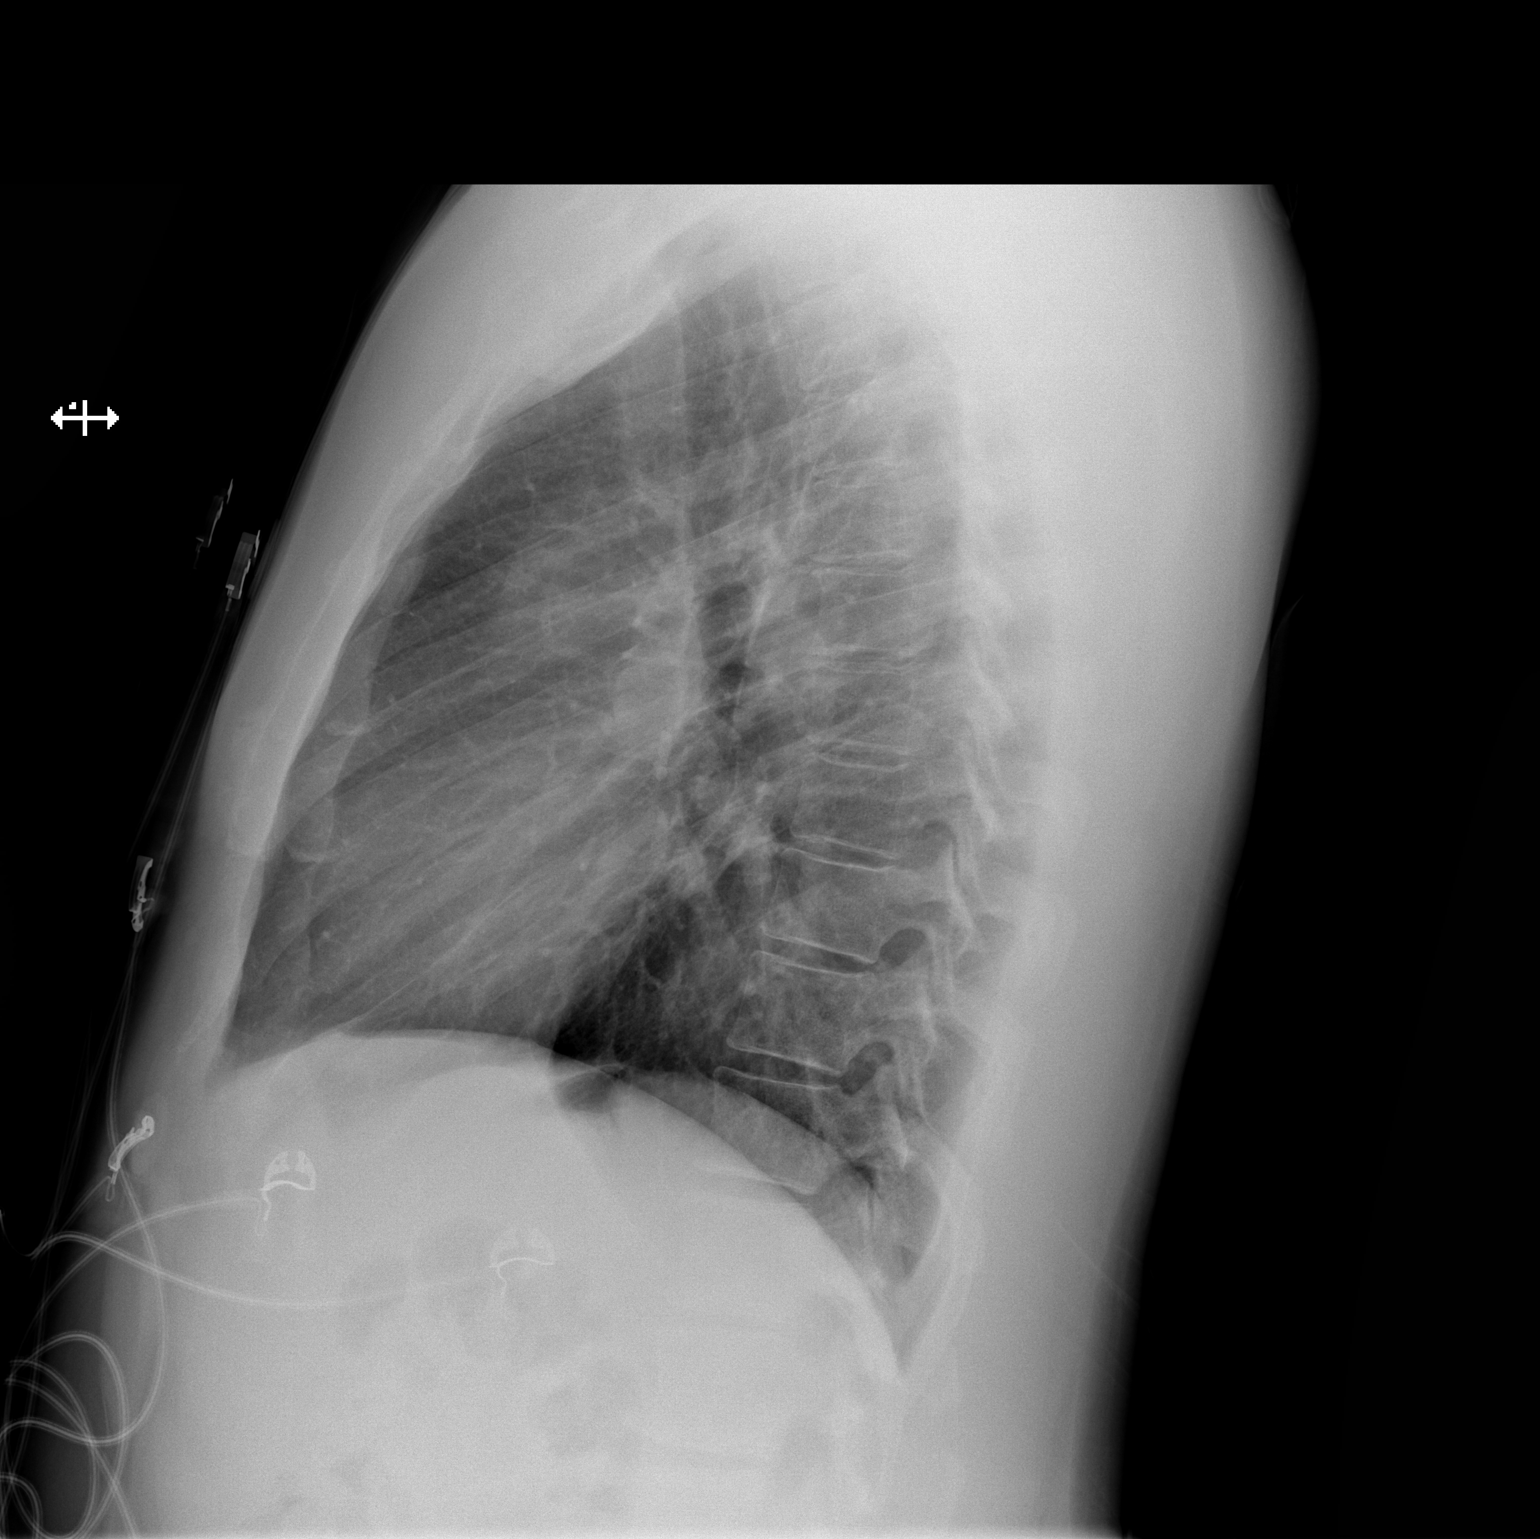

[2 of 2 positions shown; findings below may reference images not displayed]

FINDINGS: The heart size and mediastinal contours are within normal limits.
Both lungs are clear. The visualized skeletal structures are
unremarkable.
IMPRESSION: No active cardiopulmonary disease.
